# Patient Record
Sex: Female | Born: 1980 | Hispanic: No | Marital: Married | Smoking: Never smoker
Health system: Southern US, Community
[De-identification: ages and names within clinical notes are randomized; demographics above are authoritative.]

## PROBLEM LIST (undated history)

## (undated) DIAGNOSIS — D649 Anemia, unspecified: Secondary | ICD-10-CM

---

## 2013-09-30 LAB — OB RESULTS CONSOLE RUBELLA ANTIBODY, IGM: Rubella: IMMUNE

## 2013-09-30 LAB — OB RESULTS CONSOLE HIV ANTIBODY (ROUTINE TESTING): HIV: NONREACTIVE

## 2013-09-30 LAB — OB RESULTS CONSOLE ANTIBODY SCREEN: ANTIBODY SCREEN: NEGATIVE

## 2013-09-30 LAB — OB RESULTS CONSOLE HEPATITIS B SURFACE ANTIGEN: Hepatitis B Surface Ag: NEGATIVE

## 2013-09-30 LAB — OB RESULTS CONSOLE ABO/RH: RH Type: POSITIVE

## 2013-09-30 LAB — OB RESULTS CONSOLE RPR: RPR: NONREACTIVE

## 2013-11-22 ENCOUNTER — Other Ambulatory Visit: Payer: Self-pay

## 2013-11-23 ENCOUNTER — Other Ambulatory Visit (HOSPITAL_COMMUNITY): Payer: Self-pay | Admitting: Obstetrics & Gynecology

## 2013-11-23 DIAGNOSIS — Q270 Congenital absence and hypoplasia of umbilical artery: Secondary | ICD-10-CM

## 2013-11-23 DIAGNOSIS — O289 Unspecified abnormal findings on antenatal screening of mother: Secondary | ICD-10-CM

## 2013-12-02 ENCOUNTER — Ambulatory Visit (HOSPITAL_COMMUNITY)
Admission: RE | Admit: 2013-12-02 | Discharge: 2013-12-02 | Disposition: A | Discharge status: Home / Self Care | Payer: Managed Care, Other (non HMO) | Source: Ambulatory Visit | Attending: Obstetrics & Gynecology | Admitting: Obstetrics & Gynecology

## 2013-12-02 ENCOUNTER — Encounter (HOSPITAL_COMMUNITY): Payer: Self-pay

## 2013-12-02 DIAGNOSIS — O3510X0 Maternal care for (suspected) chromosomal abnormality in fetus, unspecified, not applicable or unspecified: Secondary | ICD-10-CM | POA: Insufficient documentation

## 2013-12-02 DIAGNOSIS — Q270 Congenital absence and hypoplasia of umbilical artery: Secondary | ICD-10-CM

## 2013-12-02 DIAGNOSIS — O351XX0 Maternal care for (suspected) chromosomal abnormality in fetus, not applicable or unspecified: Secondary | ICD-10-CM | POA: Insufficient documentation

## 2013-12-02 DIAGNOSIS — IMO0002 Reserved for concepts with insufficient information to code with codable children: Secondary | ICD-10-CM | POA: Insufficient documentation

## 2013-12-02 DIAGNOSIS — O289 Unspecified abnormal findings on antenatal screening of mother: Secondary | ICD-10-CM

## 2013-12-02 DIAGNOSIS — O09899 Supervision of other high risk pregnancies, unspecified trimester: Secondary | ICD-10-CM | POA: Insufficient documentation

## 2013-12-02 NOTE — Progress Notes (Signed)
Genetic Counseling  High-Risk Gestation Note  Appointment Date:  12/02/2013 Referred By: Robley FriesMody, Vaishali R, MD Date of Birth:  [birth year] Partner:  Marie LabArun Huff    Pregnancy History: [id number] Estimated Date of Delivery: 05/01/14 Estimated Gestational Age: 153w4d Attending: Particia NearingMartha Decker, MD   Ms. Marie Huff and her partner, Mr. Marie Huff, were seen for genetic counseling because of an increased risk for fetal Down syndrome based on Quad screen through [facility] and previous ultrasound finding of two vessel umbilical cord.  Detailed ultrasound was performed at the time of today's visit. Ultrasound visualized a single umbilical artery, or two vessel umbilical cord. Remaining visualized fetal anatomy appeared normal. Complete ultrasound results reported separately.  Single umbilical artery (SUA) occurs in approximately 1 in every 200 pregnancies and is defined as the presence of one umbilical artery and one umbilical vein, instead of the typical three vessel cord containing two arteries and one vein.  We discussed that the finding of an isolated SUA (no other markers or anomalies visualized) is not thought to increase the risk for fetal aneuploidy. However, the literature is conflicting regarding an association between this finding and congenital heart defects.  In addition, we discussed that SUA is known to be associated with an increased risk for fetal growth restriction.  We reviewed the options of serial ultrasound to monitor fetal growth and a fetal echocardiogram given this ultrasound finding.   They were counseled regarding the Quad screen result and the associated 1 in 173 risk for fetal Down syndrome.  We reviewed chromosomes, nondisjunction, and the common features and variable prognosis of Down syndrome.  The screen also reduced risks for trisomy 18 and ONTDs.  We also discussed other explanations for a screen positive result including: differences in maternal metabolism and  normal variation. They understand that this screening is not diagnostic for Down syndrome but provides a risk assessment. Additionally, we discussed that the level of one of the proteins analyzed (DIA) was elevated (2.23 MoM). This has been associated with an increased risk for growth restriction or poor pregnancy outcome later in pregnancy; therefore, we would recommend offering a follow up ultrasound for fetal growth in the third trimester.  Ms. Marie Huff subsequently pursued noninvasive prenatal screening (NIPS)/cell free DNA facilitated through her OB office, performed through [facility]. We reviewed that the results of this screen, InformaSeq, were within normal range, and reported as "no aneuploidy detected for chromosome 21." We reviewed the methodology of this screen, the conditions for which is screens, and the detection rates of each. Specifically, we reviewed that this test has approximate 99% sensitivity and approximate 99% specificity. They understand that this is not diagnostic for these conditions, nor does it assess for all chromosome or genetic conditions.   They were also counseled regarding diagnostic testing via amniocentesis. We reviewed the approximate 1 in 300-500 risk for complications for amniocentesis, including spontaneous pregnancy loss. The couple declined amniocentesis at this time. They understand that screening tests cannot rule out all birth defects or genetic syndromes. The patient was advised of this limitation and states she still does not want additional testing at this time.   Both family histories were briefly reviewed and found to be noncontributory for birth defects, intellectual disability, recurrent miscarriage, or known genetic conditions. Consanguinity was denied. Detailed pedigree construction was declined by the couple at this time due to time constraints.  Without further information regarding the provided family history, an accurate genetic risk cannot be  calculated. Further genetic counseling is warranted if more  information is obtained.  Ms. Marie Huff denied exposure to environmental toxins or chemical agents. She denied the use of alcohol, tobacco or street drugs. She denied significant viral illnesses during the course of her pregnancy. Her medical and surgical histories were noncontributory.   I counseled this couple for approximately 16 minutes regarding the above risks and available options.   Helyn App Jeni Salles, MS,  Certified Genetic Counselor 12/02/2013

## 2014-01-19 ENCOUNTER — Inpatient Hospital Stay (HOSPITAL_COMMUNITY): Admission: AD | Admit: 2014-01-19 | Payer: Self-pay | Source: Ambulatory Visit | Admitting: Obstetrics & Gynecology

## 2014-03-28 ENCOUNTER — Other Ambulatory Visit: Payer: Self-pay | Admitting: Obstetrics & Gynecology

## 2014-04-07 LAB — OB RESULTS CONSOLE GBS: GBS: NEGATIVE

## 2014-04-14 ENCOUNTER — Encounter (HOSPITAL_COMMUNITY): Payer: Self-pay | Admitting: Pharmacist

## 2014-04-19 NOTE — Patient Instructions (Addendum)
   Your procedure is scheduled on:  Monday, Oct 19  [facility] at: 6 AM Pick up the phone at the desk and dial [phone] and inform us of your arrival.  Please call this number if you have any problems the morning of surgery: [phone]  Remember: Do not eat or drink after midnight: Sunday Take these medicines the morning of surgery with a SIP OF WATER:  None  Do not wear jewelry, make-up, or FINGER nail polish No metal in your hair or on your body. Do not wear lotions, powders, perfumes.  You may wear deodorant.  Do not bring valuables to the hospital. Contacts, dentures or bridgework may not be worn into surgery.  Leave suitcase in the car. After Surgery it may be brought to your room. For patients being admitted to the hospital, checkout time is 11:00am the day of discharge.  Home with husband Consuella Loserun  cell [phone]

## 2014-04-22 ENCOUNTER — Encounter (HOSPITAL_COMMUNITY)
Admission: RE | Admit: 2014-04-22 | Discharge: 2014-04-22 | Disposition: A | Discharge status: Home / Self Care | Payer: Managed Care, Other (non HMO) | Source: Ambulatory Visit | Attending: Obstetrics & Gynecology | Admitting: Obstetrics & Gynecology

## 2014-04-22 ENCOUNTER — Encounter (HOSPITAL_COMMUNITY): Payer: Self-pay

## 2014-04-22 HISTORY — DX: Anemia, unspecified: D64.9

## 2014-04-22 LAB — CBC
HCT: 37.8 % (ref 36.0–46.0)
Hemoglobin: 12.4 g/dL (ref 12.0–15.0)
MCH: 27.5 pg (ref 26.0–34.0)
MCHC: 32.8 g/dL (ref 30.0–36.0)
MCV: 83.8 fL (ref 78.0–100.0)
PLATELETS: 251 10*3/uL (ref 150–400)
RBC: 4.51 MIL/uL (ref 3.87–5.11)
RDW: 15.2 % (ref 11.5–15.5)
WBC: 8.4 10*3/uL (ref 4.0–10.5)

## 2014-04-22 LAB — ABO/RH: ABO/RH(D): A POS

## 2014-04-22 LAB — TYPE AND SCREEN
ABO/RH(D): A POS
Antibody Screen: NEGATIVE

## 2014-04-22 LAB — RPR

## 2014-04-25 ENCOUNTER — Inpatient Hospital Stay (HOSPITAL_COMMUNITY)
Admission: RE | Admit: 2014-04-25 | Discharge: 2014-04-27 | DRG: 765 | Disposition: A | Discharge status: Home / Self Care | Payer: Managed Care, Other (non HMO) | Source: Ambulatory Visit | Attending: Obstetrics & Gynecology | Admitting: Obstetrics & Gynecology

## 2014-04-25 ENCOUNTER — Encounter (HOSPITAL_COMMUNITY): Payer: Managed Care, Other (non HMO) | Admitting: Anesthesiology

## 2014-04-25 ENCOUNTER — Encounter (HOSPITAL_COMMUNITY)
Admission: RE | Disposition: A | Discharge status: Home / Self Care | Payer: Self-pay | Source: Ambulatory Visit | Attending: Obstetrics & Gynecology

## 2014-04-25 ENCOUNTER — Inpatient Hospital Stay (HOSPITAL_COMMUNITY): Payer: Managed Care, Other (non HMO) | Admitting: Anesthesiology

## 2014-04-25 ENCOUNTER — Encounter (HOSPITAL_COMMUNITY): Payer: Self-pay | Admitting: Anesthesiology

## 2014-04-25 DIAGNOSIS — O3421 Maternal care for scar from previous cesarean delivery: Principal | ICD-10-CM | POA: Diagnosis present

## 2014-04-25 DIAGNOSIS — Z3A39 39 weeks gestation of pregnancy: Secondary | ICD-10-CM | POA: Diagnosis present

## 2014-04-25 DIAGNOSIS — D62 Acute posthemorrhagic anemia: Secondary | ICD-10-CM | POA: Diagnosis present

## 2014-04-25 DIAGNOSIS — Z302 Encounter for sterilization: Secondary | ICD-10-CM

## 2014-04-25 DIAGNOSIS — Z98891 History of uterine scar from previous surgery: Secondary | ICD-10-CM

## 2014-04-25 DIAGNOSIS — O9902 Anemia complicating childbirth: Secondary | ICD-10-CM | POA: Diagnosis present

## 2014-04-25 SURGERY — Surgical Case
Anesthesia: Spinal | Site: Abdomen | Laterality: Bilateral

## 2014-04-25 MED ORDER — LANOLIN HYDROUS EX OINT: 1.0000 "application " | TOPICAL_OINTMENT | CUTANEOUS | Status: DC | PRN

## 2014-04-25 MED ORDER — FENTANYL CITRATE 0.05 MG/ML IJ SOLN
INTRAMUSCULAR | Status: DC | PRN
  Administered 2014-04-25: 25 ug via INTRATHECAL

## 2014-04-25 MED ORDER — CEFAZOLIN SODIUM-DEXTROSE 2-3 GM-% IV SOLR
INTRAVENOUS | Status: AC
  Filled 2014-04-25: qty 50

## 2014-04-25 MED ORDER — DIPHENHYDRAMINE HCL 50 MG/ML IJ SOLN
12.5000 mg | INTRAMUSCULAR | Status: DC | PRN
  Administered 2014-04-25 (×2): 12.5 mg via INTRAVENOUS
  Filled 2014-04-25 (×2): qty 1

## 2014-04-25 MED ORDER — WITCH HAZEL-GLYCERIN EX PADS: 1.0000 "application " | MEDICATED_PAD | CUTANEOUS | Status: DC | PRN

## 2014-04-25 MED ORDER — MORPHINE SULFATE (PF) 0.5 MG/ML IJ SOLN
INTRAMUSCULAR | Status: DC | PRN
  Administered 2014-04-25: .15 mg via INTRATHECAL

## 2014-04-25 MED ORDER — SIMETHICONE 80 MG PO CHEW
80.0000 mg | CHEWABLE_TABLET | ORAL | Status: DC
  Administered 2014-04-26 (×2): 80 mg via ORAL
  Filled 2014-04-25 (×3): qty 1

## 2014-04-25 MED ORDER — OXYTOCIN 10 UNIT/ML IJ SOLN
40.0000 [IU] | INTRAMUSCULAR | Status: DC | PRN
  Administered 2014-04-25: 40 [IU] via INTRAVENOUS

## 2014-04-25 MED ORDER — LACTATED RINGERS IV SOLN
INTRAVENOUS | Status: DC | PRN
  Administered 2014-04-25: 09:00:00 via INTRAVENOUS

## 2014-04-25 MED ORDER — NALBUPHINE HCL 10 MG/ML IJ SOLN
INTRAMUSCULAR | Status: AC
  Filled 2014-04-25: qty 1

## 2014-04-25 MED ORDER — ONDANSETRON HCL 4 MG/2ML IJ SOLN: 4.0000 mg | Freq: Three times a day (TID) | INTRAMUSCULAR | Status: DC | PRN

## 2014-04-25 MED ORDER — OXYCODONE-ACETAMINOPHEN 5-325 MG PO TABS: 1.0000 | ORAL_TABLET | ORAL | Status: DC | PRN

## 2014-04-25 MED ORDER — SODIUM CHLORIDE 0.9 % IJ SOLN
3.0000 mL | INTRAMUSCULAR | Status: DC | PRN
Start: 2014-04-25 — End: 2014-04-27

## 2014-04-25 MED ORDER — NALOXONE HCL 1 MG/ML IJ SOLN
1.0000 ug/kg/h | INTRAVENOUS | Status: DC | PRN
  Filled 2014-04-25: qty 2

## 2014-04-25 MED ORDER — ONDANSETRON HCL 4 MG/2ML IJ SOLN
INTRAMUSCULAR | Status: DC | PRN
Start: 2014-04-25 — End: 2014-04-25
  Administered 2014-04-25: 4 mg via INTRAVENOUS

## 2014-04-25 MED ORDER — DIBUCAINE 1 % RE OINT: 1.0000 "application " | TOPICAL_OINTMENT | RECTAL | Status: DC | PRN

## 2014-04-25 MED ORDER — ONDANSETRON HCL 4 MG PO TABS: 4.0000 mg | ORAL_TABLET | ORAL | Status: DC | PRN

## 2014-04-25 MED ORDER — NALBUPHINE HCL 10 MG/ML IJ SOLN: 5.0000 mg | Freq: Once | INTRAMUSCULAR | Status: AC | PRN

## 2014-04-25 MED ORDER — KETOROLAC TROMETHAMINE 30 MG/ML IJ SOLN
30.0000 mg | Freq: Four times a day (QID) | INTRAMUSCULAR | Status: AC | PRN
  Administered 2014-04-25: 30 mg via INTRAMUSCULAR

## 2014-04-25 MED ORDER — NALOXONE HCL 0.4 MG/ML IJ SOLN: 0.4000 mg | INTRAMUSCULAR | Status: DC | PRN

## 2014-04-25 MED ORDER — DIPHENHYDRAMINE HCL 25 MG PO CAPS
25.0000 mg | ORAL_CAPSULE | ORAL | Status: DC | PRN
  Filled 2014-04-25: qty 1

## 2014-04-25 MED ORDER — DEXAMETHASONE SODIUM PHOSPHATE 4 MG/ML IJ SOLN
INTRAMUSCULAR | Status: AC
  Filled 2014-04-25: qty 1

## 2014-04-25 MED ORDER — OXYTOCIN 10 UNIT/ML IJ SOLN
INTRAMUSCULAR | Status: AC
  Filled 2014-04-25: qty 4

## 2014-04-25 MED ORDER — SIMETHICONE 80 MG PO CHEW
80.0000 mg | CHEWABLE_TABLET | Freq: Three times a day (TID) | ORAL | Status: DC
  Administered 2014-04-25 – 2014-04-26 (×4): 80 mg via ORAL
  Filled 2014-04-25 (×4): qty 1

## 2014-04-25 MED ORDER — LACTATED RINGERS IV SOLN
INTRAVENOUS | Status: DC | PRN
  Administered 2014-04-25 (×3): via INTRAVENOUS

## 2014-04-25 MED ORDER — MENTHOL 3 MG MT LOZG: 1.0000 | LOZENGE | OROMUCOSAL | Status: DC | PRN

## 2014-04-25 MED ORDER — NALBUPHINE HCL 10 MG/ML IJ SOLN
5.0000 mg | INTRAMUSCULAR | Status: DC | PRN
  Administered 2014-04-25: 5 mg via SUBCUTANEOUS

## 2014-04-25 MED ORDER — FENTANYL CITRATE 0.05 MG/ML IJ SOLN: 25.0000 ug | INTRAMUSCULAR | Status: DC | PRN

## 2014-04-25 MED ORDER — BUPIVACAINE IN DEXTROSE 0.75-8.25 % IT SOLN
INTRATHECAL | Status: DC | PRN
  Administered 2014-04-25: 10 mg via INTRATHECAL

## 2014-04-25 MED ORDER — SCOPOLAMINE 1 MG/3DAYS TD PT72
1.0000 | MEDICATED_PATCH | Freq: Once | TRANSDERMAL | Status: DC
  Administered 2014-04-25: 1.5 mg via TRANSDERMAL

## 2014-04-25 MED ORDER — ONDANSETRON HCL 4 MG/2ML IJ SOLN
INTRAMUSCULAR | Status: AC
  Filled 2014-04-25: qty 2

## 2014-04-25 MED ORDER — PRENATAL MULTIVITAMIN CH
1.0000 | ORAL_TABLET | Freq: Every day | ORAL | Status: DC
  Administered 2014-04-26: 1 via ORAL
  Filled 2014-04-25: qty 1

## 2014-04-25 MED ORDER — SIMETHICONE 80 MG PO CHEW: 80.0000 mg | CHEWABLE_TABLET | ORAL | Status: DC | PRN

## 2014-04-25 MED ORDER — LACTATED RINGERS IV SOLN
Freq: Once | INTRAVENOUS | Status: AC
  Administered 2014-04-25: 07:00:00 via INTRAVENOUS

## 2014-04-25 MED ORDER — IBUPROFEN 600 MG PO TABS
600.0000 mg | ORAL_TABLET | Freq: Four times a day (QID) | ORAL | Status: DC
  Administered 2014-04-25 – 2014-04-27 (×7): 600 mg via ORAL
  Filled 2014-04-25 (×7): qty 1

## 2014-04-25 MED ORDER — CEFAZOLIN SODIUM-DEXTROSE 2-3 GM-% IV SOLR: 2.0000 g | INTRAVENOUS | Status: DC

## 2014-04-25 MED ORDER — MORPHINE SULFATE 0.5 MG/ML IJ SOLN
INTRAMUSCULAR | Status: AC
  Filled 2014-04-25: qty 10

## 2014-04-25 MED ORDER — NALBUPHINE HCL 10 MG/ML IJ SOLN: 5.0000 mg | INTRAMUSCULAR | Status: DC | PRN

## 2014-04-25 MED ORDER — DEXAMETHASONE SODIUM PHOSPHATE 4 MG/ML IJ SOLN
INTRAMUSCULAR | Status: DC | PRN
  Administered 2014-04-25: 4 mg via INTRAVENOUS

## 2014-04-25 MED ORDER — PHENYLEPHRINE 8 MG IN D5W 100 ML (0.08MG/ML) PREMIX OPTIME
INJECTION | INTRAVENOUS | Status: DC | PRN
  Administered 2014-04-25: 40 ug/min via INTRAVENOUS

## 2014-04-25 MED ORDER — PHENYLEPHRINE HCL 10 MG/ML IJ SOLN
INTRAMUSCULAR | Status: DC | PRN
  Administered 2014-04-25: 40 ug via INTRAVENOUS

## 2014-04-25 MED ORDER — SENNOSIDES-DOCUSATE SODIUM 8.6-50 MG PO TABS
2.0000 | ORAL_TABLET | ORAL | Status: DC
  Administered 2014-04-26 (×2): 2 via ORAL
  Filled 2014-04-25 (×2): qty 2

## 2014-04-25 MED ORDER — FENTANYL CITRATE 0.05 MG/ML IJ SOLN
INTRAMUSCULAR | Status: AC
  Filled 2014-04-25: qty 2

## 2014-04-25 MED ORDER — KETOROLAC TROMETHAMINE 30 MG/ML IJ SOLN: 30.0000 mg | Freq: Four times a day (QID) | INTRAMUSCULAR | Status: AC | PRN

## 2014-04-25 MED ORDER — OXYTOCIN 40 UNITS IN LACTATED RINGERS INFUSION - SIMPLE MED: 62.5000 mL/h | INTRAVENOUS | Status: AC

## 2014-04-25 MED ORDER — SCOPOLAMINE 1 MG/3DAYS TD PT72
MEDICATED_PATCH | TRANSDERMAL | Status: AC
  Filled 2014-04-25: qty 1

## 2014-04-25 MED ORDER — TETANUS-DIPHTH-ACELL PERTUSSIS 5-2.5-18.5 LF-MCG/0.5 IM SUSP: 0.5000 mL | Freq: Once | INTRAMUSCULAR | Status: DC

## 2014-04-25 MED ORDER — KETOROLAC TROMETHAMINE 30 MG/ML IJ SOLN
INTRAMUSCULAR | Status: AC
  Administered 2014-04-25: 30 mg via INTRAMUSCULAR
  Filled 2014-04-25: qty 1

## 2014-04-25 MED ORDER — ZOLPIDEM TARTRATE 5 MG PO TABS: 5.0000 mg | ORAL_TABLET | Freq: Every evening | ORAL | Status: DC | PRN

## 2014-04-25 MED ORDER — PHENYLEPHRINE 8 MG IN D5W 100 ML (0.08MG/ML) PREMIX OPTIME
INJECTION | INTRAVENOUS | Status: AC
  Filled 2014-04-25: qty 100

## 2014-04-25 MED ORDER — ONDANSETRON HCL 4 MG/2ML IJ SOLN: 4.0000 mg | INTRAMUSCULAR | Status: DC | PRN

## 2014-04-25 MED ORDER — PHENYLEPHRINE 40 MCG/ML (10ML) SYRINGE FOR IV PUSH (FOR BLOOD PRESSURE SUPPORT)
PREFILLED_SYRINGE | INTRAVENOUS | Status: AC
  Filled 2014-04-25: qty 5

## 2014-04-25 MED ORDER — MEPERIDINE HCL 25 MG/ML IJ SOLN: 6.2500 mg | INTRAMUSCULAR | Status: DC | PRN

## 2014-04-25 MED ORDER — OXYCODONE-ACETAMINOPHEN 5-325 MG PO TABS: 2.0000 | ORAL_TABLET | ORAL | Status: DC | PRN

## 2014-04-25 MED ORDER — LACTATED RINGERS IV SOLN
INTRAVENOUS | Status: DC
  Administered 2014-04-25: 18:00:00 via INTRAVENOUS
  Administered 2014-04-26: 999 mL via INTRAVENOUS

## 2014-04-25 MED ORDER — DIPHENHYDRAMINE HCL 25 MG PO CAPS: 25.0000 mg | ORAL_CAPSULE | Freq: Four times a day (QID) | ORAL | Status: DC | PRN

## 2014-04-25 MED ORDER — LACTATED RINGERS IV SOLN: INTRAVENOUS | Status: DC

## 2014-04-25 SURGICAL SUPPLY — 36 items
BENZOIN TINCTURE PRP APPL 2/3 (GAUZE/BANDAGES/DRESSINGS) ×2 IMPLANT
CLAMP CORD UMBIL (MISCELLANEOUS) IMPLANT
CLOTH BEACON ORANGE TIMEOUT ST (SAFETY) ×2 IMPLANT
CONTAINER PREFILL 10% NBF 15ML (MISCELLANEOUS) IMPLANT
DRAPE SHEET LG 3/4 BI-LAMINATE (DRAPES) IMPLANT
DRSG OPSITE POSTOP 4X10 (GAUZE/BANDAGES/DRESSINGS) ×2 IMPLANT
DURAPREP 26ML APPLICATOR (WOUND CARE) ×2 IMPLANT
ELECT REM PT RETURN 9FT ADLT (ELECTROSURGICAL) ×2
ELECTRODE REM PT RTRN 9FT ADLT (ELECTROSURGICAL) ×1 IMPLANT
EXTRACTOR VACUUM KIWI (MISCELLANEOUS) IMPLANT
EXTRACTOR VACUUM M CUP 4 TUBE (SUCTIONS) IMPLANT
GLOVE BIO SURGEON STRL SZ7 (GLOVE) ×2 IMPLANT
GLOVE BIOGEL PI IND STRL 7.0 (GLOVE) ×1 IMPLANT
GLOVE BIOGEL PI INDICATOR 7.0 (GLOVE) ×1
GOWN STRL REUS W/TWL LRG LVL3 (GOWN DISPOSABLE) ×4 IMPLANT
KIT ABG SYR 3ML LUER SLIP (SYRINGE) IMPLANT
NEEDLE HYPO 25X5/8 SAFETYGLIDE (NEEDLE) IMPLANT
NS IRRIG 1000ML POUR BTL (IV SOLUTION) ×2 IMPLANT
PACK C SECTION WH (CUSTOM PROCEDURE TRAY) ×2 IMPLANT
PAD OB MATERNITY 4.3X12.25 (PERSONAL CARE ITEMS) ×2 IMPLANT
RTRCTR C-SECT PINK 25CM LRG (MISCELLANEOUS) IMPLANT
STAPLER VISISTAT 35W (STAPLE) IMPLANT
STRIP CLOSURE SKIN 1/2X4 (GAUZE/BANDAGES/DRESSINGS) ×2 IMPLANT
SUT MON AB-0 CT1 36 (SUTURE) ×6 IMPLANT
SUT PLAIN 0 NONE (SUTURE) IMPLANT
SUT PLAIN 2 0 (SUTURE)
SUT PLAIN ABS 2-0 CT1 27XMFL (SUTURE) IMPLANT
SUT VIC AB 0 CT1 27 (SUTURE) ×2
SUT VIC AB 0 CT1 27XBRD ANBCTR (SUTURE) ×2 IMPLANT
SUT VIC AB 2-0 CT1 27 (SUTURE) ×2
SUT VIC AB 2-0 CT1 TAPERPNT 27 (SUTURE) ×2 IMPLANT
SUT VIC AB 4-0 KS 27 (SUTURE) IMPLANT
SUT VICRYL 0 TIES 12 18 (SUTURE) IMPLANT
TOWEL OR 17X24 6PK STRL BLUE (TOWEL DISPOSABLE) ×2 IMPLANT
TRAY FOLEY CATH 14FR (SET/KITS/TRAYS/PACK) IMPLANT
WATER STERILE IRR 1000ML POUR (IV SOLUTION) ×2 IMPLANT

## 2014-04-25 NOTE — Anesthesia Postprocedure Evaluation (Signed)
Anesthesia Post Note  Patient: Marie Huff  Procedure(s) Performed: Procedure(s) (LRB): Repeat CESAREAN SECTION WITH BILATERAL TUBAL LIGATION (Bilateral)  Anesthesia type: Spinal  Patient location: PACU  Post pain: Pain level controlled  Post assessment: Post-op Vital signs reviewed  Last Vitals:  Filed Vitals:   04/25/14 0915  BP: 95/55  Pulse: 94  Temp:   Resp: 18    Post vital signs: Reviewed  Level of consciousness: awake  Complications: No apparent anesthesia complications

## 2014-04-25 NOTE — Transfer of Care (Signed)
Immediate Anesthesia Transfer of Care Note  Patient: Marie Huff  Procedure(s) Performed: Procedure(s) with comments: Repeat CESAREAN SECTION WITH BILATERAL TUBAL LIGATION (Bilateral) - EDD: 05/01/14  Patient Location: PACU  Anesthesia Type:Spinal  Level of Consciousness: awake, alert , oriented and patient cooperative  Airway & Oxygen Therapy: Patient Spontanous Breathing  Post-op Assessment: Report given to PACU RN and Post -op Vital signs reviewed and stable  Post vital signs: Reviewed and stable  Complications: No apparent anesthesia complications

## 2014-04-25 NOTE — Op Note (Signed)
Cesarean Section Procedure Note   Marie Huff 04/25/2014  Indications: Scheduled Proceedure/Maternal Request 39.1 wks.   Procedure: Repeat C/section and bilateral tubal ligation by partial salpingectomy  Pre-operative Diagnosis: Previous Cesarean Section, Desires Sterilization.                                               2 vessel umbilical cord  Post-operative Diagnosis: Same   Surgeon: Robley FriesVaishali R Laverne Klugh, MD   Assistants: Marlinda Mikeanya Bailey, CNM  Anesthesia: spinal   Procedure Details:  The patient was seen in the Holding Room. The risks, benefits, complications, treatment options, and expected outcomes were discussed with the patient. The patient concurred with the proposed plan, giving informed consent. identified as Marie Huff and the procedure verified as C-Section Delivery. A Time Out was held and the above information confirmed.  After induction of anesthesia, the patient was draped and prepped in the usual sterile manner. A Pfannenstiel incision was made and carried down through the subcutaneous tissue to the fascia. Fascial incision was made and extended transversely. The fascia was separated from the underlying rectus tissue superiorly and inferiorly. The peritoneum was identified and entered. Peritoneal incision was extended longitudinally. The utero-vesical peritoneal reflection was incised transversely and the bladder flap was bluntly freed from the lower uterine segment. Alexis-O retractor was placed. A low transverse uterine incision was made. Delivered from cephalic presentation was a healthy female infant with vigorous cry at 8.11 am on 04/25/14 with Apgar scores of 9 at one minute and 9 at five minutes. Cord ph was not sent the umbilical cord was clamped and cut cord blood was obtained for evaluation. The placenta was removed Intact and appeared normal. The uterine outline, tubes and ovaries appeared normal, but lower segment was very thin. The uterine incision was  closed with running locked sutures of 0 Monocryl in 2 layers.  Hemostasis was observed. Bilateral tubal ligation was performed using two ties of 2-0 Plain gut on both tubes and partial salpingectomy performed. Stumps were hemostatic. Alexis retractor was removed. Peritoneal closure done with 2-0 Vicryl. The fascia was then reapproximated with running sutures of 0Vicryl. The subcuticular closure was performed using 4-0Vicryl. Steristrips and Honeycomb dressing placed.  Instrument, sponge, and needle counts were correct prior the abdominal closure and were correct at the conclusion of the case.   Findings:  Thin lower segment. Clear amniotic fluid. Baby girl delivery cephalic at 8.11 am on 04/25/2014 with Apgars 9 and 9 at 1 and 5 minutes. 2 vessel cord, normal placenta, sent to pathology. Normal tubes and ovaries.    Estimated Blood Loss: 700 cc  Total IV Fluids: 3000 ml  LR  Urine Output: 300CC OF clear urine  Specimens: Partially cut both fallopian tubes and placenta  Complications: no complications  Disposition: PACU - hemodynamically stable.   Maternal Condition: stable   Baby condition / location:  Couplet care / Skin to Skin  Attending Attestation: I performed the procedure.   Signed: Surgeon(s): Robley FriesVaishali R Donie Lemelin, MD

## 2014-04-25 NOTE — Anesthesia Postprocedure Evaluation (Signed)
  Anesthesia Post-op Note  Patient: Marie Huff  Procedure(s) Performed: Procedure(s) with comments: Repeat CESAREAN SECTION WITH BILATERAL TUBAL LIGATION (Bilateral) - EDD: 05/01/14  Patient Location: PACU and Mother/Baby  Anesthesia Type:Spinal  Level of Consciousness: awake, alert , oriented and patient cooperative  Airway and Oxygen Therapy: Patient Spontanous Breathing  Post-op Pain: none  Post-op Assessment: Post-op Vital signs reviewed, Patient's Cardiovascular Status Stable, Respiratory Function Stable, Patent Airway, No signs of Nausea or vomiting, Adequate PO intake and Pain level controlled  Post-op Vital Signs: Reviewed and stable  Last Vitals:  Filed Vitals:   04/25/14 1806  BP: 102/60  Pulse: 73  Temp: 36.8 C  Resp: 18    Complications: No apparent anesthesia complications

## 2014-04-25 NOTE — H&P (Signed)
Deanne Menzel is a 33 y.o. female presenting for repeat C/section and tubal ligation at 39.1 wks. No complaints, good FMs. No bleeding/leaking per vagina.  [name] Dr Juliene PinaMody primary. Abn QUAD (DOwn's risk 1:175), Informaseq- Diploid, normal, 2Vessel cord, normal fetal echo and interval growth at 28, 34 wks. Antenatal testing wkly from 36 wks reactive.   History OB History   Grav Para Term Preterm Abortions TAB SAB Ect Mult Living   2 1 1  0 0 0 0 0 0 1     Past Medical History  Diagnosis Date  . Anemia    Past Surgical History  Procedure Laterality Date  . Cesarean section  12/2010    [place]   Family History: family history is not on file. Social History:  reports that she has never smoked. She has never used smokeless tobacco. She reports that she does not drink alcohol or use illicit drugs.   Prenatal Transfer Tool  Maternal Diabetes: No Genetic Screening: abn QUAD for Downs, but normal diploid Informaseq Maternal Ultrasounds/Referrals: Normal anatomy but 2vessel cord, MFM sono- normal, interval growth normal. Fetal Ultrasounds or other Referrals:  Fetal echo normal Maternal Substance Abuse:  No Significant Maternal Medications:  None Significant Maternal Lab Results:  None Other Comments:  None  ROS neg   Blood pressure 117/72, pulse 100, temperature 98.6 F (37 C), temperature source Oral, resp. rate 18, last menstrual period 07/25/2013, SpO2 98.00%. Exam Physical Exam  Physical exam:  A&O x 3, no acute distress. Pleasant HEENT neg, no thyromegaly Lungs CTA bilat CV RRR, S1S2 normal Abdo soft, non tender, non acute Extr no edema/ tenderness Pelvic deferred FHT  130s Toco none  Prenatal labs: ABO, Rh: --/--/A POS (10/16 65780850) Antibody: NEG (10/16 0845) Rubella: Immune (03/26 0000) RPR: NON REAC (10/16 0844)  HBsAg: Negative (03/26 0000)  HIV: Non-reactive (03/26 0000)  GBS: Negative (10/01 0000)   Assessment/Plan: 33 yo, G2P1 at 39.1 wks,  wants repeat C/section and permanent sterility with tubal ligation.  2Vessel cord with normal fetal echo and normal growth sonograms and Antesting. SAw MFM. Informaseq normal.   Risks/complications of surgery reviewed incl infection, bleeding, damage to internal organs including bladder, bowels, ureters, blood vessels, other risks from anesthesia, VTE and delayed complications of any surgery, complications in future surgery reviewed. Also discussed neonatal complications incl difficult delivery, laceration, vacuum assistance, TTN etc. Pt understands and agrees, all concerns addressed.   Permanent Tubal ligation/ excision with irreversibility/ risk of regret/ small risk of failure and ectopic pregnancy reviewed. Salpingectomy noted to show reduced ovarian cancer risk in hysteterectomy data reviewed.     Caeleigh Prohaska R 04/25/2014, 6:58 AM

## 2014-04-25 NOTE — Anesthesia Preprocedure Evaluation (Signed)
Anesthesia Evaluation  Patient identified by MRN, date of birth, ID band Patient awake    Reviewed: Allergy & Precautions  Airway Mallampati: II TM Distance: >3 FB Neck ROM: Full    Dental no notable dental hx. (+) Teeth Intact   Pulmonary neg pulmonary ROS,  breath sounds clear to auscultation  Pulmonary exam normal       Cardiovascular negative cardio ROS  Rhythm:Regular Rate:Normal     Neuro/Psych negative neurological ROS  negative psych ROS   GI/Hepatic negative GI ROS, Neg liver ROS,   Endo/Other  negative endocrine ROS  Renal/GU negative Renal ROS  negative genitourinary   Musculoskeletal negative musculoskeletal ROS (+)   Abdominal   Peds  Hematology  (+) anemia ,   Anesthesia Other Findings   Reproductive/Obstetrics (+) Pregnancy Previous C/Section Desires Sterilization                           Anesthesia Physical Anesthesia Plan  ASA: II  Anesthesia Plan: Spinal   Post-op Pain Management:    Induction:   Airway Management Planned: Natural Airway  Additional Equipment:   Intra-op Plan:   Post-operative Plan:   Informed Consent: I have reviewed the patients History and Physical, chart, labs and discussed the procedure including the risks, benefits and alternatives for the proposed anesthesia with the patient or authorized representative who has indicated his/her understanding and acceptance.     Plan Discussed with: Anesthesiologist, CRNA and Surgeon  Anesthesia Plan Comments:         Anesthesia Quick Evaluation

## 2014-04-25 NOTE — Anesthesia Procedure Notes (Signed)
Spinal  Patient location during procedure: OR Start time: 04/25/2014 7:40 AM Staffing Anesthesiologist: Sonyia Muro A. Performed by: anesthesiologist  Preanesthetic Checklist Completed: patient identified, site marked, surgical consent, pre-op evaluation, timeout performed, IV checked, risks and benefits discussed and monitors and equipment checked Spinal Block Patient position: sitting Prep: site prepped and draped and DuraPrep Patient monitoring: heart rate, cardiac monitor, continuous pulse ox and blood pressure Approach: midline Location: L4-5 Injection technique: single-shot Needle Needle type: Sprotte  Needle gauge: 24 G Needle length: 9 cm Needle insertion depth: 5 cm Assessment Sensory level: T4 Additional Notes Patient tolerated procedure well. Adequate sensory level.

## 2014-04-25 NOTE — Lactation Note (Signed)
This note was copied from the chart of Marie Huff. Lactation Consultation Note Initial visit at 8 hours of age.  Baby is asleep in crib.  MOm reports a good feeding  And denies pain with latch.  Mom has erect nipples with WNL breast, mom is able to hand express with colostrum easliy expressed.[facility] resources given and discussed.  Encouraged to feed with early cues on demand.  Early newborn behavior discussed. FOB at bedside supportive. Maybe need to review due to mom's current sleepy state.   Mom to call for assist as needed.    Patient Name: Marie Tula Pyper Today's Date: 04/25/2014 Reason for consult: Initial assessment   Maternal Data Has patient been taught Hand Expression?: Yes Does the patient have breastfeeding experience prior to this delivery?: Yes  Feeding Feeding Type: Breast Fed Length of feed: 15 min  LATCH Score/Interventions Latch: Grasps breast easily, tongue down, lips flanged, rhythmical sucking.  Audible Swallowing: None Intervention(s): Skin to skin  Type of Nipple: Everted at rest and after stimulation  Comfort (Breast/Nipple): Soft / non-tender     Hold (Positioning): No assistance needed to correctly position infant at breast. Intervention(s): Breastfeeding basics reviewed  LATCH Score: 8  Lactation Tools Discussed/Used     Consult Status Consult Status: Follow-up Date: 04/26/14 Follow-up type: In-patient    Shoptaw, Arvella MerlesJana Lynn 04/25/2014, 5:01 PM

## 2014-04-25 NOTE — Addendum Note (Signed)
Addendum created 04/25/14 1816 by Orlie Pollenebra R Derek Huneycutt, CRNA   Modules edited: Notes Section   Notes Section:  File: [id number]

## 2014-04-26 ENCOUNTER — Encounter (HOSPITAL_COMMUNITY): Payer: Self-pay | Admitting: Obstetrics & Gynecology

## 2014-04-26 LAB — CBC
HCT: 33.7 % — ABNORMAL LOW (ref 36.0–46.0)
Hemoglobin: 11 g/dL — ABNORMAL LOW (ref 12.0–15.0)
MCH: 27.1 pg (ref 26.0–34.0)
MCHC: 32.6 g/dL (ref 30.0–36.0)
MCV: 83 fL (ref 78.0–100.0)
Platelets: 255 10*3/uL (ref 150–400)
RBC: 4.06 MIL/uL (ref 3.87–5.11)
RDW: 15.2 % (ref 11.5–15.5)
WBC: 14.5 10*3/uL — ABNORMAL HIGH (ref 4.0–10.5)

## 2014-04-26 NOTE — Lactation Note (Signed)
This note was copied from the chart of Marie Huff. Lactation Consultation Note  Patient Name: Marie Correne Carmical Today's Date: 04/26/2014 Reason for consult: Follow-up assessment RN, Dorene Grebeatalie had cared for this mom today and reported that [facility] and breastfeeding is much improved.  Total feedings since midnight are 9 with output above minimum for this hour of life.  LC visited and spoke with mom who also reports that breastfeeding is going well and denies any breastfeeding concerns at this time.  LC encouraged her to continue feeding baby on cue and to call for help as needed.   Maternal Data    Feeding Feeding Type: Breast Fed Length of feed: 10 min  LATCH Score/Interventions           most recent LATCH assessment, by RN = 8           Lactation Tools Discussed/Used   Cue feedings  Consult Status Consult Status: Follow-up Date: 04/27/14 Follow-up type: In-patient    Warrick ParisianBryant, Faolan [place] [facility] 04/26/2014, 9:26 PM

## 2014-04-26 NOTE — Plan of Care (Signed)
Problem: Phase I Progression Outcomes Goal: Other Phase I Outcomes/Goals Patient is progressing in her care. She is doing well.

## 2014-04-26 NOTE — Progress Notes (Signed)
POD # 1  Subjective: Pt reports feeling well/ Pain controlled with Motrin and Percocet Tolerating po/ Foley d/c'd voiding without problems/ No n/v/ Flatus present Activity: ad lib Bleeding is light Newborn info:  Information for the patient's newborn:  Marie Huff, Marie Huff [[id number]][[id number]]  female Feeding: breast   Objective:  VS:  Filed Vitals:   04/25/14 2035 04/25/14 2040 04/26/14 0059 04/26/14 0458  BP: 117/65 114/66 108/63 106/58  Pulse: 73 75 63 66  Temp:   98.1 F (36.7 C) 98.1 F (36.7 C)  TempSrc:   Oral Oral  Resp: 18 18  18   Height:      Weight:      SpO2:    97%     I&O: Intake/Output     10/19 0701 - 10/20 0700 10/20 0701 - 10/21 0700   P.O. 1850    I.V. (mL/kg) 3950 (56.2)    Total Intake(mL/kg) 5800 (82.5)    Urine (mL/kg/hr) 3675 (2.2)    Blood 700 (0.4)    Total Output 4375     Net +1425             Recent Labs  04/26/14 0602  WBC 14.5*  HGB 11.0*  HCT 33.7*  PLT 255    Blood type: --/--/A POS (10/16 0850) Rubella: Immune (03/26 0000)    Physical Exam:  General: alert and cooperative CV: Regular rate and rhythm Resp: CTA bilaterally Abdomen: soft, nontender, normal bowel sounds Incision: healing well, no erythema, no hernia, no seroma, no swelling, well approximated, scant drk drainage rt lateral honeycomb dsg, outlined-no extension. Uterine Fundus: firm, below umbilicus, nontender Lochia: minimal Ext: extremities normal, atraumatic, no cyanosis or edema and Homans sign is negative, no sign of DVT    Assessment: POD # 1/ G2P2002/ S/P C/Section d/t repeat Doing well  Plan: Ambulate Continue routine post op orders Influenza and Tdap already administered in office   Signed: Donette LarryBHAMBRI, Cariann Kinnamon, N, MSN, CNM 04/26/2014, 10:26 AM

## 2014-04-27 MED ORDER — IBUPROFEN 600 MG PO TABS: 600.0000 mg | ORAL_TABLET | Freq: Four times a day (QID) | ORAL | Status: AC

## 2014-04-27 MED ORDER — OXYCODONE-ACETAMINOPHEN 5-325 MG PO TABS: 1.0000 | ORAL_TABLET | ORAL | Status: AC | PRN

## 2014-04-27 NOTE — Discharge Summary (Signed)
Reviewed and agree with note and plan. V.Manami Tutor, MD  

## 2014-04-27 NOTE — Progress Notes (Signed)
POSTOPERATIVE DAY # 2 S/P CS   S:         Reports feeling well - desires early DC today             Tolerating po intake / no nausea / no vomiting / + flatus / no BM             Bleeding is light             Pain controlled with motrin and percocet             Up ad lib / ambulatory/ voiding QS  Newborn breast feeding    O:  VS: BP 103/59  Pulse 57  Temp(Src) 98.2 F (36.8 C) (Oral)  Resp 18  Ht 5\' 3"  (1.6 m)  Wt 70.308 kg (155 lb)  BMI 27.46 kg/m2  SpO2 97%  LMP 07/25/2013  Breastfeeding? Unknown   LABS:               Recent Labs  04/26/14 0602  WBC 14.5*  HGB 11.0*  PLT 255               Bloodtype: --/--/A POS (10/16 0850)  Rubella: Immune (03/26 0000)                                Physical Exam:             Alert and Oriented X3  Lungs: Clear and unlabored  Heart: regular rate and rhythm / no mumurs  Abdomen: soft, non-tender, non-distended active BS             Fundus: firm, non-tender, Ueven             Dressing intact honeycomb              Incision:  approximated with suture / bo erythema / no ecchymosis / no drainage  Perineum: intact  Lochia: light  Extremities: no edema, no calf pain or tenderness, negative Homans  A:        POD # 2 S/P repeat CS with BTL            Mild ABL anemia - stable  P:        Routine postoperative care              DC home             WOB booklet - instructions reviewed   Marlinda MikeBAILEY, TANYA CNM, MSN, [facility] 04/27/2014, 9:53 AM

## 2014-04-27 NOTE — Discharge Summary (Signed)
  POSTOPERATIVE DISCHARGE SUMMARY:  Patient IDShirlean Huff: Crimson Rabon MRN: [id number] DOB/AGE: Nov 05, 1980 33 y.o.  Admit date: 04/25/2014 Admission Diagnoses: 39.1 weeks / previous cesarean section - desires elective repeat / undesired fertility  Discharge date:  04/27/2014 Discharge Diagnoses: POD 2 s/p cesarean section / mild IDA anemia with ABL - stable  Prenatal history: G2P2002   EDC : 05/01/2014, by Last Menstrual Period  [facility]  Primary provider : Mody Prenatal course complicated by previous CS  Prenatal Labs: ABO, Rh: --/--/A POS (10/16 [id number])  Antibody: NEG (10/16 0845) Rubella: Immune (03/26 0000)   RPR: NON REAC (10/16 0844)  HBsAg: Negative (03/26 0000)  HIV: Non-reactive (03/26 0000)  GTT : NL GBS: Negative (10/01 0000)   Medical / Surgical History :  Past medical history:  Past Medical History  Diagnosis Date  . Anemia   . Postpartum care following cesarean delivery (10/19) 04/25/2014    Past surgical history:  Past Surgical History  Procedure Laterality Date  . Cesarean section  12/2010    CA  . Cesarean section with bilateral tubal ligation Bilateral 04/25/2014    Procedure: Repeat CESAREAN SECTION WITH BILATERAL TUBAL LIGATION;  Surgeon: Robley FriesVaishali R Mody, MD;  Location: [facility];  Service: Obstetrics;  Laterality: Bilateral;  EDD: 05/01/14    Family History: No family history on file.  Social History:  reports that she has never smoked. She has never used smokeless tobacco. She reports that she does not drink alcohol or use illicit drugs.  Allergies: Review of patient's allergies indicates no known allergies.   Current Medications at time of admission:  Prior to Admission medications   Medication Sig Start Date End Date Taking? Authorizing Provider  Ferrous Sulfate Dried 140 (45 FE) MG TBCR Take 1 tablet by mouth daily.   Yes Historical Provider, MD  Prenatal Vit w/Fe-Methylfol-FA (PNV PO) Take 1 tablet by mouth  daily.    Yes Historical Provider, MD    Procedures: Cesarean section delivery on 04/25/2014 with delivery of  female newborn by Dr Juliene PinaMody  Bilateral tubal sterilization  See operative report for further details APGAR (1 MIN): 9   APGAR (5 MINS): 9    Postoperative / postpartum course:  Uncomplicated with discharge on POD 2   Discharge Instructions:  Discharged Condition: stable  Activity: pelvic rest and postoperative restrictions x 2   Diet: routine  Medications:    Medication List         Ferrous Sulfate Dried 140 (45 FE) MG Tbcr  Take 1 tablet by mouth daily.     ibuprofen 600 MG tablet  Commonly known as:  ADVIL,MOTRIN  Take 1 tablet (600 mg total) by mouth every 6 (six) hours.     oxyCODONE-acetaminophen 5-325 MG per tablet  Commonly known as:  PERCOCET/ROXICET  Take 1 tablet by mouth every 4 (four) hours as needed (for pain scale less than 7).     PNV PO  Take 1 tablet by mouth daily.        Wound Care: keep clean and dry / remove honeycomb POD 5 Postpartum Instructions: [name] discharge booklet - instructions reviewed  Discharge to: Home  Follow up :   [name] in 6 weeks for routine postpartum visit with Dr Juliene PinaMody                Signed: Marlinda MikeBAILEY, Ronit Marczak CNM, MSN, [facility] 04/27/2014, 9:58 AM

## 2014-05-09 ENCOUNTER — Encounter (HOSPITAL_COMMUNITY): Payer: Self-pay | Admitting: Obstetrics & Gynecology

## 2015-06-05 IMAGING — US US OB DETAIL+14 WK
1 series · 12 of 28 positions shown · non-contrast
Comparison: none

[Series 1: us ob detail+14 wk · 0.21mm/px · 12 of 80 slices shown]
[im 3/80]
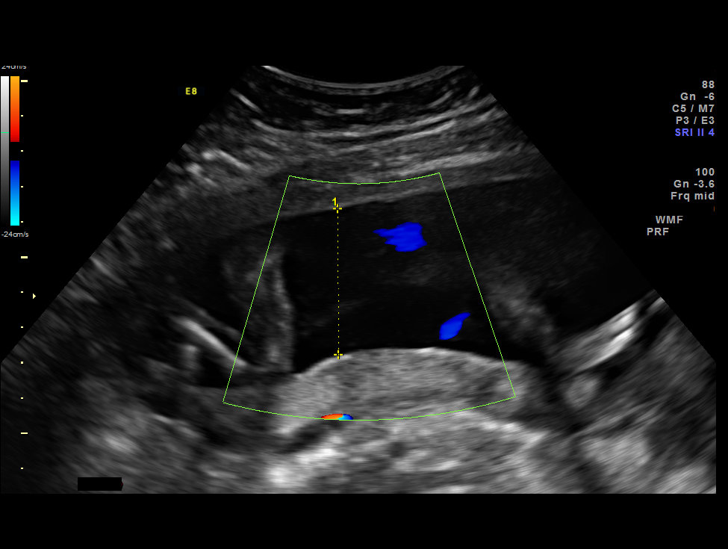
[im 9/80]
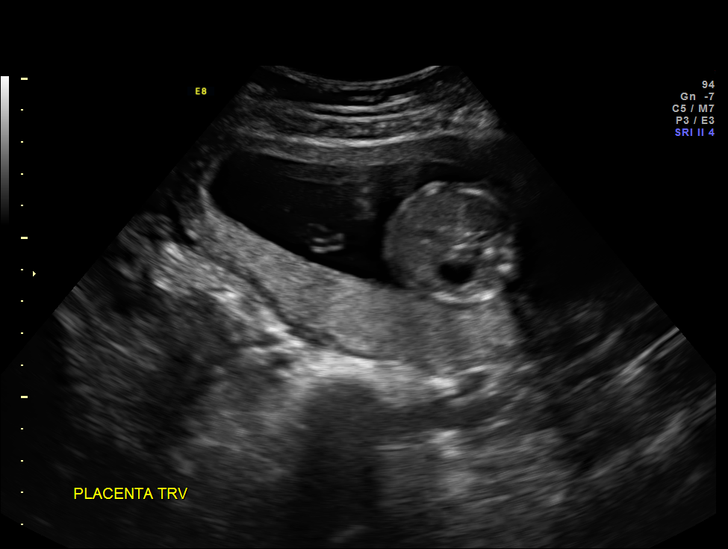
[im 15/80]
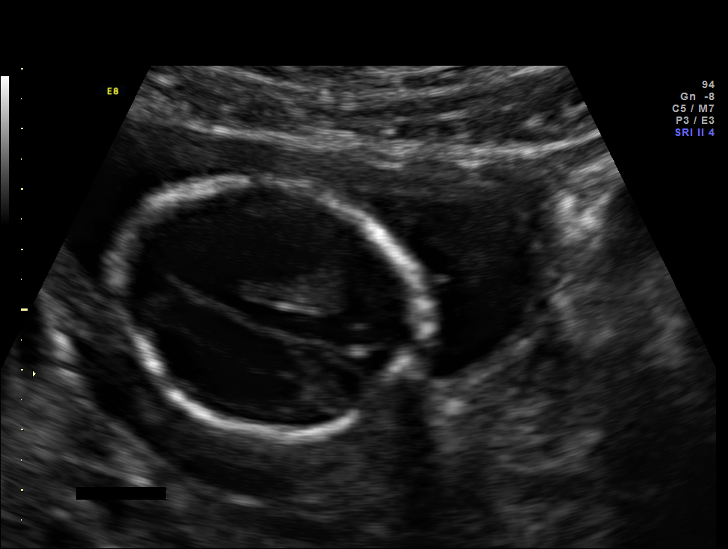
[im 24/80]
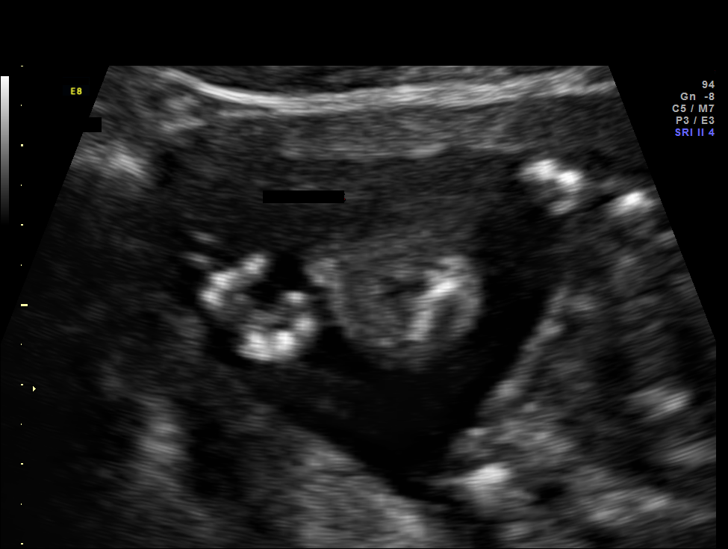
[im 30/80]
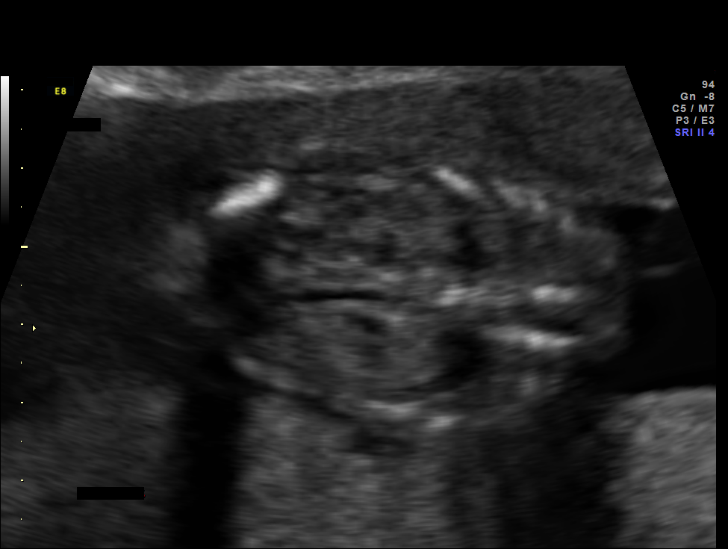
[im 36/80]
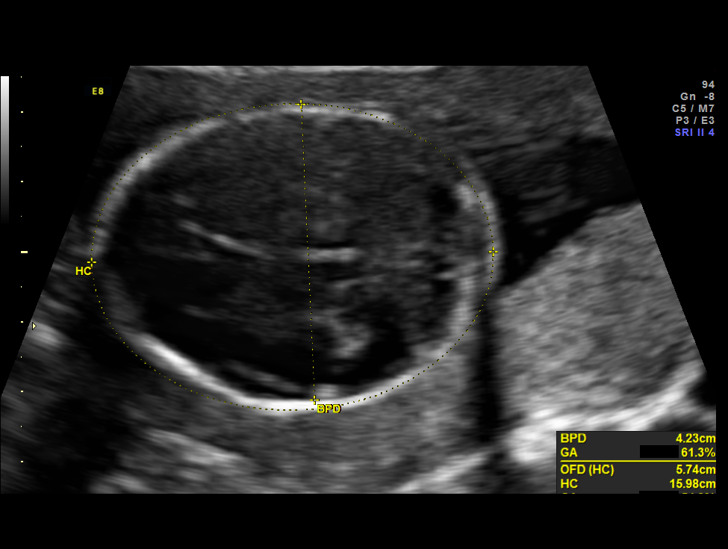
[im 44/80]
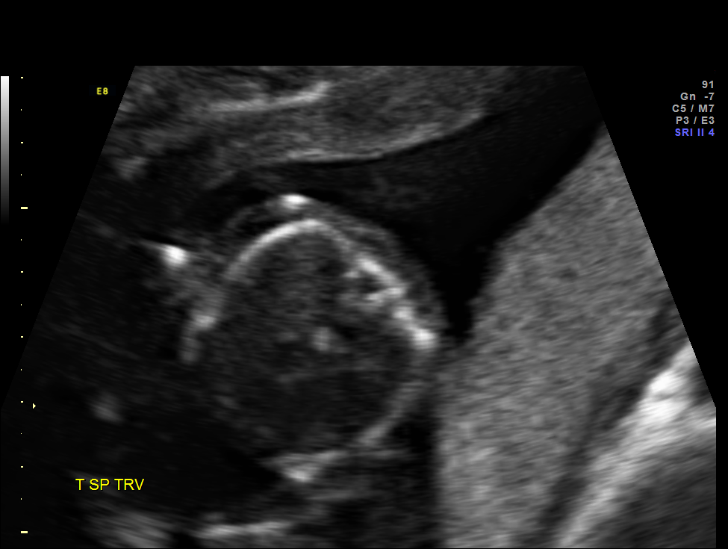
[im 50/80]
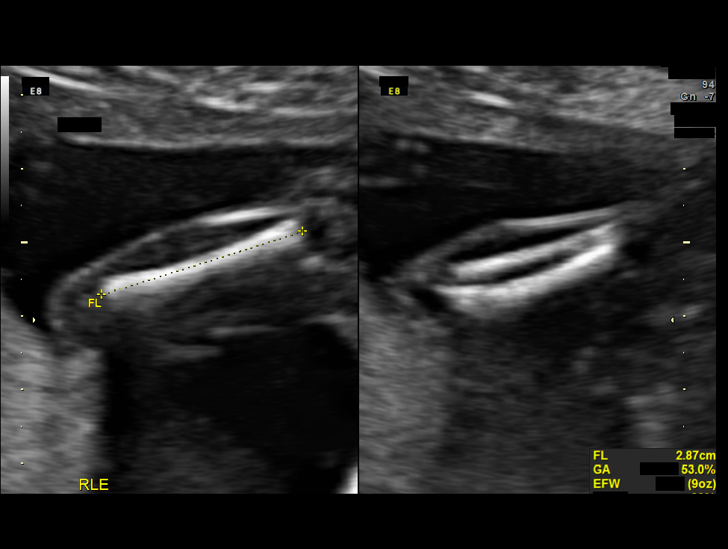
[im 56/80]
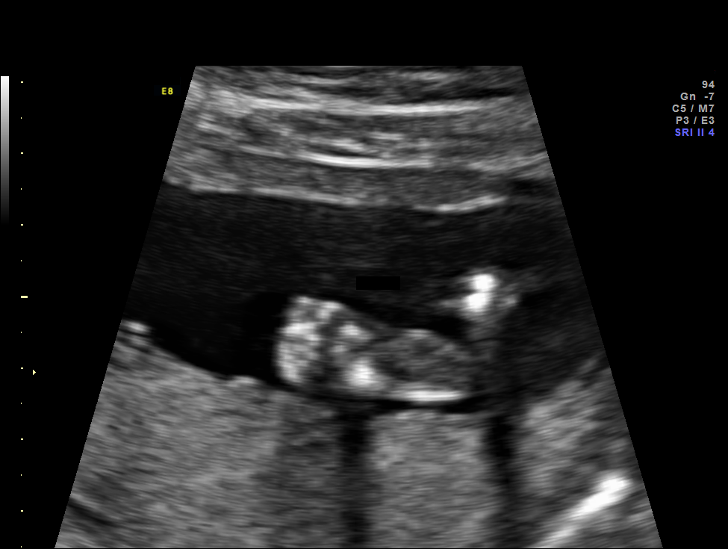
[im 65/80]
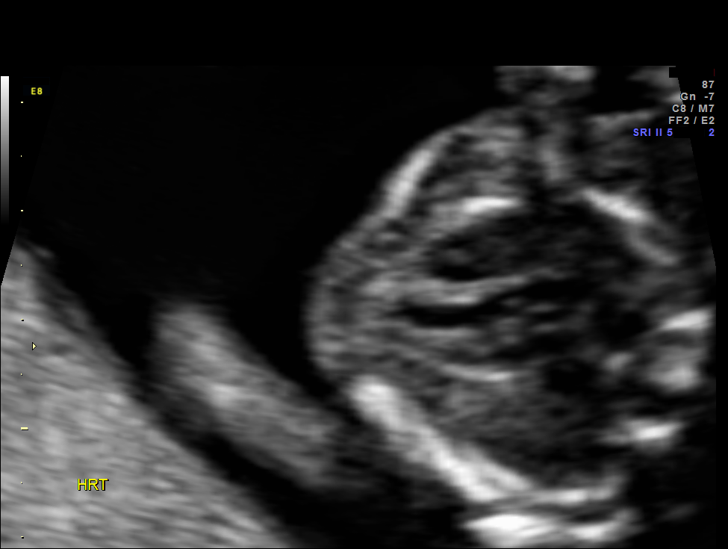
[im 71/80]
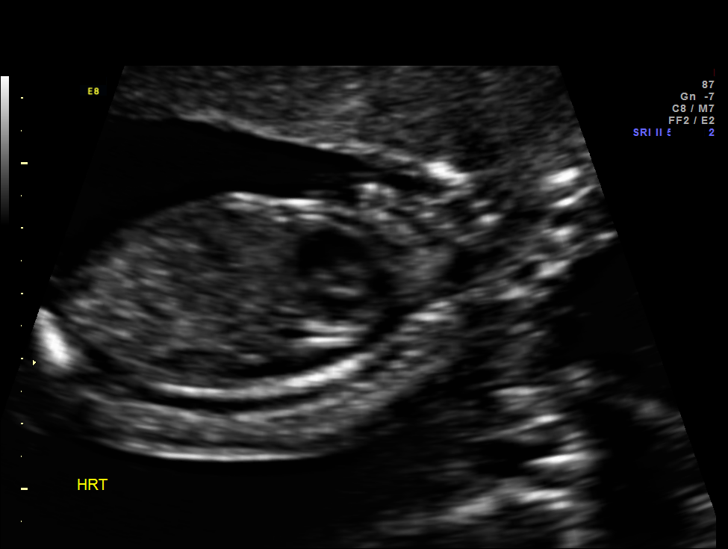
[im 77/80]
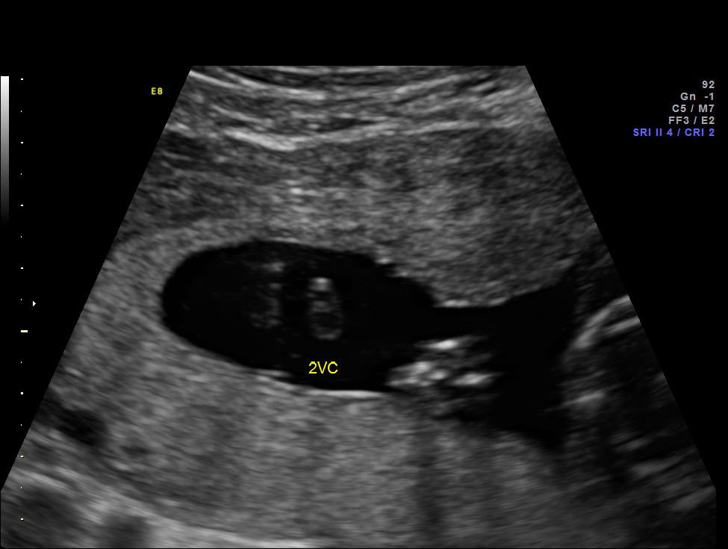

[12 of 28 positions shown; findings below may reference images not displayed]

OBSTETRICS REPORT
                      (Signed Final 12/02/2013 [DATE])

Service(s) Provided

 US OB DETAIL + 14 WK                                  76811.0
Indications

 Detailed fetal anatomic survey
 2 vessel umbilical cord
 Abnormal biochemical screen (quad) for Trisomy
 21 ([DATE]) - low risk NIPS
 Elevated Inhibin (2.23 MoM)
 Poor obstetric history: Previous gestational HTN
Fetal Evaluation

 Num Of Fetuses:    1
 Fetal Heart Rate:  152                          bpm
 Cardiac Activity:  Observed
 Presentation:      Cephalic
 Placenta:          Posterior, above cervical
                    os
 P. Cord            Visualized, central
 Insertion:

 Amniotic Fluid
 AFI FV:      Subjectively within normal limits
                                             Larg Pckt:     4.1  cm
Biometry

 BPD:     42.8  mm     G. Age:  19w 0d                CI:         74.7   70 - 86
 OFD:     57.3  mm                                    FL/HC:      18.0   16.1 -

 HC:     160.3  mm     G. Age:  18w 6d       57  %    HC/AC:      1.23   1.09 -

 AC:     130.8  mm     G. Age:  18w 4d       48  %    FL/BPD:
 FL:      28.9  mm     G. Age:  18w 6d       55  %    FL/AC:      22.1   20 - 24
 HUM:     26.9  mm     G. Age:  18w 4d       52  %
 CER:     17.6  mm     G. Age:  17w 4d       23  %
 NFT:        3  mm

 Est. FW:     256  gm      0 lb 9 oz     50  %
Gestational Age

 LMP:           18w 4d        Date:  07/25/13                 EDD:   05/01/14
 U/S Today:     18w 6d                                        EDD:   04/29/14
 Best:          18w 4d     Det. By:  LMP  (07/25/13)          EDD:   05/01/14
Anatomy

 Cranium:          Appears normal         Aortic Arch:      Appears normal
 Fetal Cavum:      Appears normal         Ductal Arch:      Appears normal
 Ventricles:       Appears normal         Diaphragm:        Appears normal
 Choroid Plexus:   Appears normal         Stomach:          Appears normal, left
                                                            sided
 Cerebellum:       Appears normal         Abdomen:          Appears normal
 Posterior Fossa:  Appears normal         Abdominal Wall:   Appears nml (cord
                                                            insert, abd wall)
 Nuchal Fold:      Appears normal         Cord Vessels:     2 vessel cord,
                                                            absent right [place]
                                                            [place]
 Face:             Appears normal         Kidneys:          Appear normal
                   (orbits and profile)
 Lips:             Appears normal         Bladder:          Appears normal
 Palate:           Appears normal         Spine:            Appears normal
 Heart:            Appears normal         Lower             Appears normal
                   (4CH, axis, and        Extremities:
                   situs)
 RVOT:             Appears normal         Upper             Appears normal
                                          Extremities:
 LVOT:             Appears normal

 Other:  Fetus appears to be a female. Heels and 5th digit visualized. Nasal
         bone visualized.
Targeted Anatomy

 Fetal Central Nervous System
 Cisterna Magna:
Cervix Uterus Adnexa

 Cervical Length:    3.7      cm

 Cervix:       Normal appearance by transabdominal scan.

 Adnexa:     No abnormality visualized.
Impression

 SIUP at 18+4 weeks
 Single umbilical artery
 All other detailed fetal anatomy was seen and appeared
 normal
 Markers of aneuploidy: SUA
 Normal amniotic fluid volume
 Measurements consistent with LMP dating

 The US findings were shared with Ms. Jualswap. She met
 with Shimau Disapa, our genetic counselor, and declined
 amniocentesis.
Recommendations

 Fetal ECHO
 Follow-up ultrasound for growth in the early third trimester
 (increased inhibin)
